# Patient Record
Sex: Male | Born: 1965 | Race: Black or African American | Hispanic: No | Marital: Married | Smoking: Never smoker
Health system: Southern US, Community
[De-identification: ages and names within clinical notes are randomized; demographics above are authoritative.]

## PROBLEM LIST (undated history)

## (undated) DIAGNOSIS — G4730 Sleep apnea, unspecified: Secondary | ICD-10-CM

## (undated) DIAGNOSIS — R683 Clubbing of fingers: Secondary | ICD-10-CM

## (undated) DIAGNOSIS — I10 Essential (primary) hypertension: Secondary | ICD-10-CM

## (undated) HISTORY — DX: Clubbing of fingers: R68.3

## (undated) HISTORY — PX: OTHER SURGICAL HISTORY: SHX169

## (undated) HISTORY — PX: NASAL SEPTUM SURGERY: SHX37

## (undated) HISTORY — DX: Sleep apnea, unspecified: G47.30

## (undated) HISTORY — PX: TONSILLECTOMY: SUR1361

## (undated) HISTORY — PX: HERNIA REPAIR: SHX51

## (undated) HISTORY — DX: Essential (primary) hypertension: I10

---

## 2004-10-25 ENCOUNTER — Ambulatory Visit (HOSPITAL_BASED_OUTPATIENT_CLINIC_OR_DEPARTMENT_OTHER): Admission: RE | Admit: 2004-10-25 | Discharge: 2004-10-25 | Payer: Self-pay | Admitting: General Surgery

## 2004-10-25 ENCOUNTER — Ambulatory Visit (HOSPITAL_COMMUNITY): Admission: RE | Admit: 2004-10-25 | Discharge: 2004-10-25 | Payer: Self-pay | Admitting: General Surgery

## 2004-10-25 ENCOUNTER — Observation Stay (HOSPITAL_COMMUNITY): Admission: AD | Admit: 2004-10-25 | Discharge: 2004-10-26 | Payer: Self-pay | Admitting: General Surgery

## 2006-08-22 ENCOUNTER — Emergency Department (HOSPITAL_COMMUNITY): Admission: EM | Admit: 2006-08-22 | Discharge: 2006-08-22 | Payer: Self-pay | Admitting: Emergency Medicine

## 2006-11-06 ENCOUNTER — Emergency Department (HOSPITAL_COMMUNITY): Admission: EM | Admit: 2006-11-06 | Discharge: 2006-11-06 | Payer: Self-pay | Admitting: Family Medicine

## 2007-05-30 ENCOUNTER — Ambulatory Visit (HOSPITAL_COMMUNITY): Admission: RE | Admit: 2007-05-30 | Discharge: 2007-05-30 | Payer: Self-pay | Admitting: Neurological Surgery

## 2008-06-18 ENCOUNTER — Emergency Department (HOSPITAL_COMMUNITY): Admission: EM | Admit: 2008-06-18 | Discharge: 2008-06-18 | Payer: Self-pay | Admitting: Emergency Medicine

## 2008-08-10 ENCOUNTER — Emergency Department (HOSPITAL_COMMUNITY): Admission: EM | Admit: 2008-08-10 | Discharge: 2008-08-11 | Payer: Self-pay | Admitting: Emergency Medicine

## 2009-06-06 ENCOUNTER — Observation Stay (HOSPITAL_COMMUNITY): Admission: EM | Admit: 2009-06-06 | Discharge: 2009-06-06 | Payer: Self-pay | Admitting: Emergency Medicine

## 2009-06-06 ENCOUNTER — Encounter (INDEPENDENT_AMBULATORY_CARE_PROVIDER_SITE_OTHER): Payer: Self-pay | Admitting: Emergency Medicine

## 2009-06-06 ENCOUNTER — Ambulatory Visit: Payer: Self-pay | Admitting: Cardiology

## 2011-01-13 LAB — POCT CARDIAC MARKERS
CKMB, poc: 2 ng/mL (ref 1.0–8.0)
Myoglobin, poc: 72.3 ng/mL (ref 12–200)
Troponin i, poc: <0.05 ng/mL (ref 0.00–0.09)
Troponin i, poc: <0.05 ng/mL (ref 0.00–0.09)

## 2011-01-13 LAB — POCT I-STAT, CHEM 8
BUN: 8 mg/dL (ref 6–23)
Calcium, Ion: 1.18 mmol/L (ref 1.12–1.32)
Chloride: 103 mEq/L (ref 96–112)
Glucose, Bld: 199 mg/dL — ABNORMAL HIGH (ref 70–99)
HCT: 46 % (ref 39.0–52.0)

## 2011-01-13 LAB — D-DIMER, QUANTITATIVE: D-Dimer, Quant: 0.26 ug/mL-FEU (ref 0.00–0.48)

## 2011-01-13 LAB — HEPATIC FUNCTION PANEL
AST: 27 U/L (ref 0–37)
Albumin: 3.5 g/dL (ref 3.5–5.2)

## 2011-02-23 NOTE — Op Note (Signed)
NAMERAFIK, KOPPEL              ACCOUNT NO.:  [id number]   MEDICAL RECORD NO.:  [id number]          PATIENT TYPE:  AMB   LOCATION:  [facility]                         FACILITY:  [facility]   PHYSICIAN:  Angelia Mould. Derrell Lolling, M.D.DATE OF BIRTH:  October 03, 1966   DATE OF PROCEDURE:  10/25/2004  DATE OF DISCHARGE:                                 OPERATIVE REPORT   PREOPERATIVE DIAGNOSIS:  Bilateral inguinal hernias.   POSTOPERATIVE DIAGNOSIS:  Bilateral inguinal hernias.   OPERATION PERFORMED:  Repair of bilateral inguinal hernias with  polypropylene mesh (Lichtenstein repairs).   SURGEON:  Angelia Mould. Derrell Lolling, M.D.   OPERATIVE INDICATION:  This is a 45 year old black man who has developed  painful bilateral inguinal hernias over the past few months.  He notices a  bulge on both sides and some discomfort, somewhat more on the right than on  the left.  He has been evaluated recently and his exam reveals bilateral  inguinal hernias that are medium sized.  They do not extend into the  scrotum.  They are tender but completely reducible.  There is no scrotal or  testicular mass.  He is brought to the operating room electively.   OPERATIVE FINDINGS:  The patient had bilateral indirect inguinal hernias of  the sliding type.  These were easily dissected away from the cord structures  and reduced.  He had some weakness in the direct floor of the inguinal canal  but no real bulge there.   OPERATIVE TECHNIQUE:  Following the induction of general endotracheal  anesthesia, the patient's lower abdomen and groins were prepped and draped  in a sterile fashion.  He was given intravenous antibiotics.  The patient  was identified.   Bilateral oblique incisions were made overlying both inguinal canals.  On  both sides, the dissection was carried down through the subcutaneous tissue  exposing the aponeurosis of the external oblique.  The conduct of the  procedure was essentially the same on both sides so I will  dictate the  technique just once and note any changes as we go along.   On each side, the external oblique was incised in the direction of its  fibers and opening up the external inguinal ring.  Self-retaining retractors  were placed.  The cord structures were mobilized and encircled with a  Penrose drain.  On both sides, he had large indirect hernia sacs that were  sliding hernia types.  These were dissected completely away from the cord.  Structures all the way back to the level of the internal inguinal ring.  The  indirect hernia sac was then reduced into the abdominal space.  The internal  ring was then closed with figure-of-eight sutures of 2-0 Vicryl and this  held the reduction in place quite nicely.  The cord was examined.  There was  no other abnormality.  On each side, the floor of the inguinal canal was  repaired and reinforced with an on-lay graft of polypropylene mesh.  A 3-  inch x 6-inch piece of mesh was brought to the operative field on each side.  It was trimmed at the  corners to fit the wound.  On each side, the mesh was  sutured in place with running sutures of 2-0 Prolene and interrupted  mattress sutures of 2-0 Prolene.  The mesh was sutured so as to generally  overlap the fascia at the pubic tubercle, along the inguinal ligament  inferiorly.  Medially a couple of interrupted mattress sutures were placed  to secure the mesh and superiorly and superolaterally, the mesh was sutured  in place with a running suture of 2-0 Prolene.  The mesh was incised  laterally so as to wrap around the cord structures at the internal ring.  The tails of the mesh were overlapped laterally and the suture line was  completed.  A couple of extra sutures were placed to tighten up the mesh  slightly around the cord structures.  This provided a very secure repair  both medially and lateral to the cord structures but allowed an adequate  opening for the cord structures.  The wounds were  irrigated with saline.  The external oblique was closed with a running suture of 2-0 Vicryl, placing  the cord structures deep to the external oblique.  Scarpa's fascia was  closed with 3-0 Vicryl sutures and the skin on each side was closed with a  running subcuticular suture of 4-0 Monocryl and Steri-Strips.  Clean  bandages were placed and the patient was taken to the recovery room in  stable condition.  Estimated blood loss was about 20 cc.  Complications:  None.  Sponge and instrument counts were correct.     Hayw   HMI/MEDQ  D:  10/25/2004  T:  10/25/2004  Job:  [id number]

## 2011-05-27 IMAGING — CR DG CHEST 2V
2 series · 2 of 2 positions shown · non-contrast
Comparison: 06/18/2008

CLINICAL DATA: Chest pain, shortness of breath.

CHEST - 2 VIEW

[w chest pa]
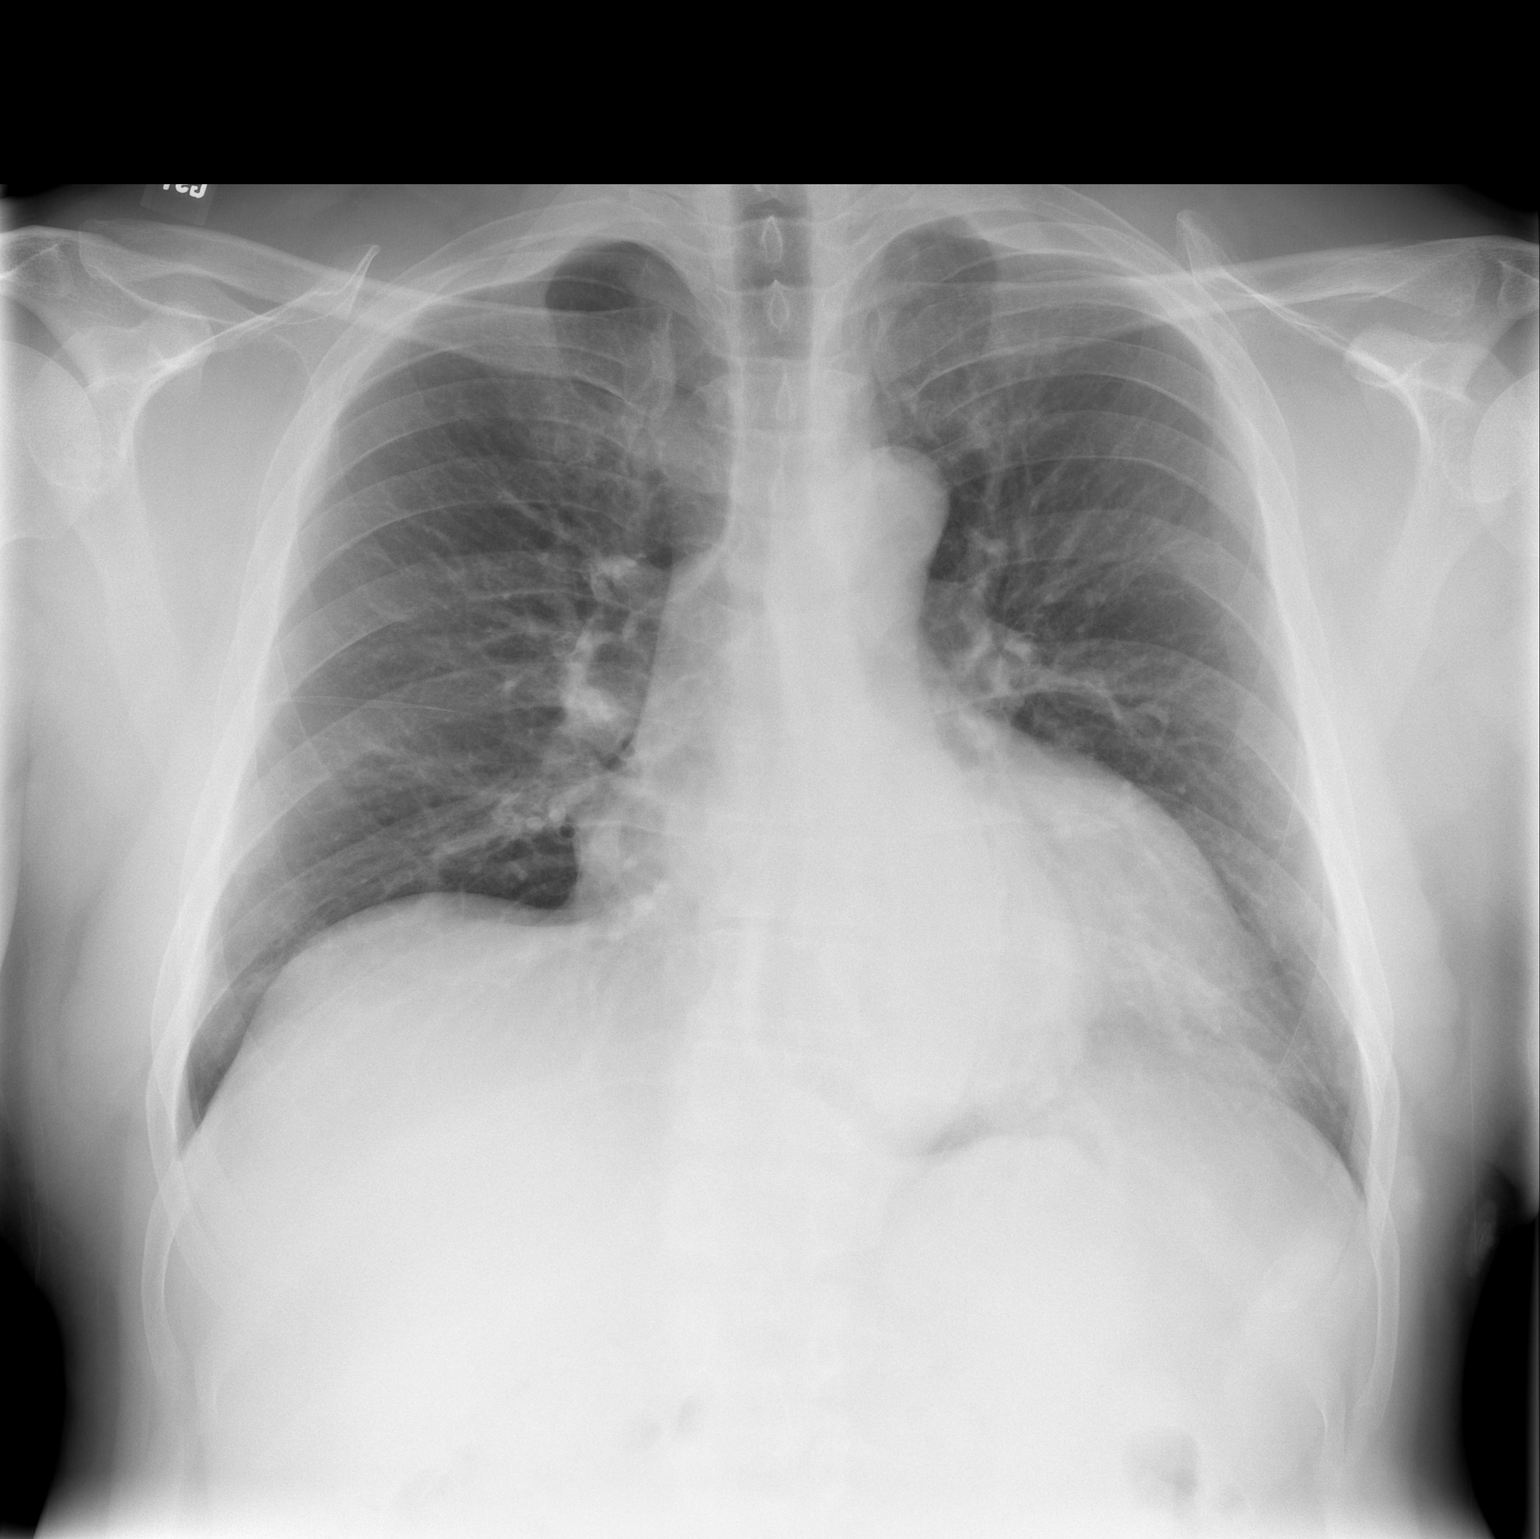

[w chest lat]
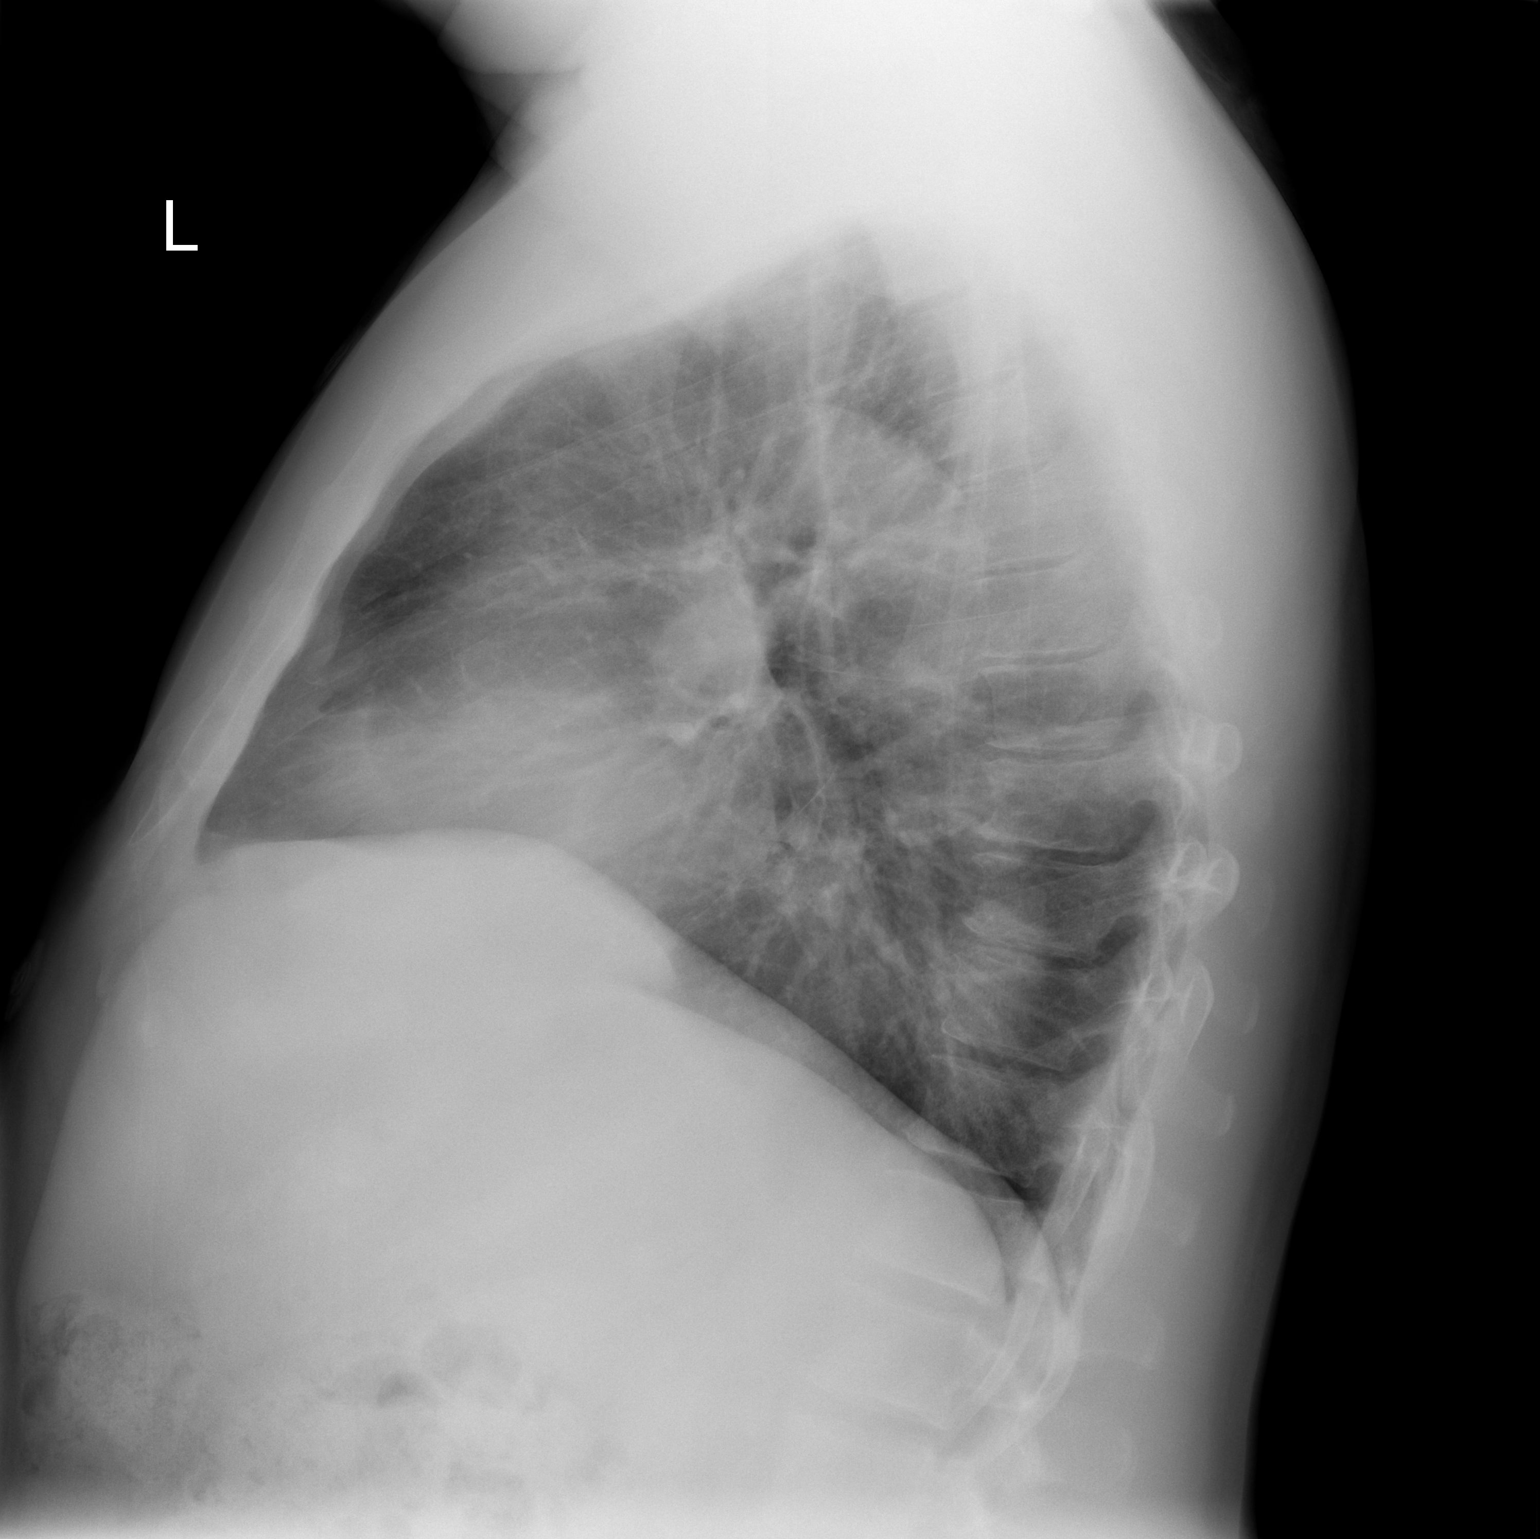

[2 of 2 positions shown; findings below may reference images not displayed]

FINDINGS: There is mild cardiomegaly.  No focal airspace opacities
or effusions.  Tortuosity of the thoracic aorta noted.  Stable
hiatal hernia.  No acute bony abnormality.
IMPRESSION: Stable cardiomegaly.

Hiatal hernia.

## 2011-07-10 LAB — POCT I-STAT, CHEM 8
Creatinine, Ser: 1.1
Glucose, Bld: 114 — ABNORMAL HIGH
Hemoglobin: 15.3
TCO2: 29

## 2011-07-10 LAB — POCT CARDIAC MARKERS: Myoglobin, poc: 94.3

## 2011-07-11 LAB — POCT I-STAT, CHEM 8
Calcium, Ion: 1.23
Glucose, Bld: 114 — ABNORMAL HIGH
HCT: 51
Hemoglobin: 17.3 — ABNORMAL HIGH
Potassium: 3.6
TCO2: 32

## 2011-07-11 LAB — URINALYSIS, ROUTINE W REFLEX MICROSCOPIC
Bilirubin Urine: NEGATIVE
Nitrite: NEGATIVE
Protein, ur: NEGATIVE
Specific Gravity, Urine: 1.004 — ABNORMAL LOW
Urobilinogen, UA: 0.2

## 2011-07-11 LAB — DIFFERENTIAL
Basophils Absolute: 0.4 — ABNORMAL HIGH
Basophils Relative: 6 — ABNORMAL HIGH
Eosinophils Absolute: 0.5
Monocytes Absolute: 0.6
Neutro Abs: 1.9
Neutrophils Relative %: 29 — ABNORMAL LOW

## 2011-07-11 LAB — POCT CARDIAC MARKERS: Troponin i, poc: <0.05

## 2011-07-11 LAB — CBC
MCHC: 33.5
RDW: 12.8

## 2011-12-31 ENCOUNTER — Ambulatory Visit (HOSPITAL_BASED_OUTPATIENT_CLINIC_OR_DEPARTMENT_OTHER): Payer: BC Managed Care – PPO | Attending: Family Medicine | Admitting: Radiology

## 2011-12-31 VITALS — Ht 72.0 in | Wt 235.0 lb

## 2011-12-31 DIAGNOSIS — G4733 Obstructive sleep apnea (adult) (pediatric): Secondary | ICD-10-CM | POA: Insufficient documentation

## 2012-01-04 DIAGNOSIS — R0989 Other specified symptoms and signs involving the circulatory and respiratory systems: Secondary | ICD-10-CM

## 2012-01-04 DIAGNOSIS — R0609 Other forms of dyspnea: Secondary | ICD-10-CM

## 2012-01-04 DIAGNOSIS — G4733 Obstructive sleep apnea (adult) (pediatric): Secondary | ICD-10-CM

## 2012-01-04 NOTE — Procedures (Signed)
NAMEJUAQUIN, Ian Vaughn              ACCOUNT NO.:  [id number]  MEDICAL RECORD NO.:  [id number]          PATIENT TYPE:  OUT  LOCATION:  [facility]                 FACILITY:  [facility]  PHYSICIAN:  Aleigh Grunden D. Maple Hudson, MD, FCCP, FACPDATE OF BIRTH:  17-Nov-1964  DATE OF STUDY:  12/31/2011                           NOCTURNAL POLYSOMNOGRAM  REFERRING PHYSICIAN:  Joanie Coddington MANNING  REFERRING PHYSICIAN:  Jeanice Lim, MD  INDICATION FOR STUDY:  Hypersomnia with sleep apnea.  EPWORTH SLEEPINESS SCORE:  11/24.  BMI 31.9, weight 235 pounds, height 72 inches, neck 14.5 inches.  HOME MEDICATIONS:  Charted and reviewed.  SLEEP ARCHITECTURE:  Total sleep time 272.5 minutes with sleep efficiency 75.8%.  Stage I was 11.7%, stage II 68.1%, stage III absent. REM 20.2% of total sleep time.  Sleep latency 4 minutes, REM latency 54 minutes, awake after sleep onset 39 minutes.  Arousal index 51.7. Bedtime medication:  Losartan, cinnamon bark, aspirin.  RESPIRATORY DATA:  Apnea/hypopnea index (AHI) 25.3 per hour.  A total of 115 events was scored including 32 obstructive apneas, 6 central apneas, 2 mixed apneas, 75 hypopneas.  Events were not positional.  REM AHI 51.3 per hour.  This is a diagnostic NPSG protocol as ordered and CPAP titration was not done.  OXYGEN DATA:  Moderate to loud snoring with oxygen desaturation to a nadir of 80% and a mean oxygen saturation through the study of 90.6% on room air.  A 72.3 minutes was recorded with room air saturation less than 90% during the study time.  CARDIAC DATA:  Sinus rhythm with PAC and PVCs.  MOVEMENT/PARASOMNIA:  No significant movement disturbance.  Bathroom x1.  IMPRESSION/RECOMMENDATION: 1. Moderate obstructive sleep apnea/hypopnea syndrome, AHI 25.3 per     hour with nonpositional events.  Moderate to loud snoring and     oxygen desaturation to a nadir of 80% with mean oxygen saturation     through the study of 90.6% on room air.  A total of  72.3 minutes     was recorded with room air saturation less than 90% during the     total study time. 2. Consider return for dedicated CPAP titration study or evaluate for     alternative clinical management as appropriate.     Linton Stolp D. Maple Hudson, MD, [facility], FACP Diplomate, American Board of Sleep Medicine    CDY/MEDQ  D:  01/04/2012 09:51:46  T:  01/04/2012 23:47:17  Job:  [id number]

## 2012-01-07 ENCOUNTER — Ambulatory Visit: Payer: BC Managed Care – PPO

## 2012-01-21 ENCOUNTER — Encounter: Payer: BC Managed Care – PPO | Attending: Family Medicine | Admitting: Dietician

## 2012-01-21 DIAGNOSIS — E119 Type 2 diabetes mellitus without complications: Secondary | ICD-10-CM | POA: Insufficient documentation

## 2012-01-21 DIAGNOSIS — Z713 Dietary counseling and surveillance: Secondary | ICD-10-CM | POA: Insufficient documentation

## 2012-01-22 ENCOUNTER — Encounter: Payer: Self-pay | Admitting: Dietician

## 2012-01-22 NOTE — Progress Notes (Signed)
  Patient was seen on 01/21/2012 for the first of a series of three diabetes self-management courses at the [facility]. The following learning objectives were met by the patient during this course:   Defines the role of glucose and insulin  Identifies type of diabetes and pathophysiology  Defines the diagnostic criteria for diabetes and prediabetes  States the risk factors for Type 2 Diabetes  States the symptoms of Type 2 Diabetes  Defines Type 2 Diabetes treatment goals  Defines Type 2 Diabetes treatment options  States the rationale for glucose monitoring  Identifies A1C, glucose targets, and testing times  Identifies proper sharps disposal  Defines the purpose of a diabetes food plan  Identifies carbohydrate food groups  Defines effects of carbohydrate foods on glucose levels  Identifies carbohydrate choices/grams/food labels  States benefits of physical activity and effect on glucose  Review of suggested activity guidelines  HgA1C: 8.3 % 12/24/2011  Handouts given during class include:  Type 2 Diabetes: Basics Book  My Food Plan Book  Food and Activity Log  Patient has established the following initial goals:  Increase my exercise  Follow a diabetes meal plan  Lose weight  Follow-Up Plan: Core Class 2 within one month

## 2012-02-14 ENCOUNTER — Encounter: Payer: BC Managed Care – PPO | Attending: Family Medicine

## 2012-02-14 DIAGNOSIS — E119 Type 2 diabetes mellitus without complications: Secondary | ICD-10-CM | POA: Insufficient documentation

## 2012-02-14 DIAGNOSIS — Z713 Dietary counseling and surveillance: Secondary | ICD-10-CM | POA: Insufficient documentation

## 2012-02-26 NOTE — Progress Notes (Signed)
  Patient was seen on 02/14/2012 for the second of a series of three diabetes self-management courses at the [facility]. The following learning objectives were met by the patient during this course:   Explain basic nutrition maintenance and quality assurance  Describe causes, symptoms and treatment of hypoglycemia and hyperglycemia  Explain how to manage diabetes during illness  Describe the importance of good nutrition for health and healthy eating strategies  List strategies to follow meal plan when dining out  Describe the effects of alcohol on glucose and how to use it safely  Describe problem solving skills for day-to-day glucose challenges  Describe strategies to use when treatment plan needs to change  Identify important factors involved in successful weight loss  Describe ways to remain physically active  Describe the impact of regular activity on insulin resistance  Handouts given in class:  Refrigerator magnet for Sick Day Guidelines  [facility] Oral medication/insulin handout  Follow-Up Plan: Patient will attend the final class of the ADA Diabetes Self-Care Education.

## 2012-02-28 ENCOUNTER — Ambulatory Visit: Payer: BC Managed Care – PPO

## 2012-04-29 ENCOUNTER — Ambulatory Visit: Payer: BC Managed Care – PPO

## 2012-05-27 ENCOUNTER — Encounter: Payer: BC Managed Care – PPO | Attending: Family Medicine | Admitting: Dietician

## 2012-05-27 DIAGNOSIS — E119 Type 2 diabetes mellitus without complications: Secondary | ICD-10-CM | POA: Insufficient documentation

## 2012-05-27 DIAGNOSIS — Z713 Dietary counseling and surveillance: Secondary | ICD-10-CM | POA: Insufficient documentation

## 2012-05-28 NOTE — Progress Notes (Signed)
  Patient was seen on 05/27/2012 for the third of a series of three diabetes self-management courses at the [facility]. The following learning objectives were met by the patient during this course:    Describe how diabetes changes over time   Identify diabetes complications and ways to prevent them   Describe strategies that can promote heart health including lowering blood pressure and cholesterol   Describe strategies to lower dietary fat and sodium in the diet   Identify physical activities that benefit cardiovascular health   Evaluate success in meeting personal goal   Describe the belief that they can live successfully with diabetes day to day   Establish 2-3 goals that they will plan to diligently work on until they return for the free 60-month follow-up visit  The following handouts were given in class:  3 Month Follow Up Visit handout  Goal setting handout  Class evaluation form  Your patient has established the following 3 month goals for diabetes self-care:  Be active 40 minutes or more 3 times per week.  To help manage stress, I will do Tai Chi at least 3 times per week.  Mountain Bike 5 + miles per week.  Follow-Up Plan: Patient will attend a 3 month follow-up visit for diabetes self-management education.

## 2012-07-07 ENCOUNTER — Emergency Department (HOSPITAL_COMMUNITY)
Admission: EM | Admit: 2012-07-07 | Discharge: 2012-07-08 | Disposition: A | Discharge status: Home / Self Care | Payer: BC Managed Care – PPO | Attending: Emergency Medicine | Admitting: Emergency Medicine

## 2012-07-07 ENCOUNTER — Encounter (HOSPITAL_COMMUNITY): Payer: Self-pay | Admitting: *Deleted

## 2012-07-07 ENCOUNTER — Emergency Department (HOSPITAL_COMMUNITY): Payer: BC Managed Care – PPO

## 2012-07-07 DIAGNOSIS — R5381 Other malaise: Secondary | ICD-10-CM | POA: Insufficient documentation

## 2012-07-07 DIAGNOSIS — R61 Generalized hyperhidrosis: Secondary | ICD-10-CM | POA: Insufficient documentation

## 2012-07-07 DIAGNOSIS — R5383 Other fatigue: Secondary | ICD-10-CM | POA: Insufficient documentation

## 2012-07-07 DIAGNOSIS — I10 Essential (primary) hypertension: Secondary | ICD-10-CM | POA: Insufficient documentation

## 2012-07-07 DIAGNOSIS — E119 Type 2 diabetes mellitus without complications: Secondary | ICD-10-CM | POA: Insufficient documentation

## 2012-07-07 DIAGNOSIS — Z79899 Other long term (current) drug therapy: Secondary | ICD-10-CM | POA: Insufficient documentation

## 2012-07-07 LAB — GLUCOSE, CAPILLARY

## 2012-07-07 NOTE — ED Notes (Signed)
Pt c/o feeling "weird" tonight; felt sweaty; wife took bp and was elevated; continues to feel weird; wife said earlier tonight she felt his pulse and it was irregular; pt warm and dry on triage assessment; denies chest pain or sob; states has had three episodes in last two wks of upper chest sensation like he is "choking on a marble"; c/o bilateral flank pain on and off last few wks

## 2012-07-08 LAB — POCT I-STAT, CHEM 8
Chloride: 102 mEq/L (ref 96–112)
Glucose, Bld: 204 mg/dL — ABNORMAL HIGH (ref 70–99)
HCT: 45 % (ref 39.0–52.0)
Hemoglobin: 15.3 g/dL (ref 13.0–17.0)
Potassium: 3.7 mEq/L (ref 3.5–5.1)
Sodium: 142 mEq/L (ref 135–145)

## 2012-07-08 LAB — URINALYSIS, ROUTINE W REFLEX MICROSCOPIC
Bilirubin Urine: NEGATIVE
Leukocytes, UA: NEGATIVE
Nitrite: NEGATIVE
Specific Gravity, Urine: 1.028 (ref 1.005–1.030)
Urobilinogen, UA: 0.2 mg/dL (ref 0.0–1.0)
pH: 6 (ref 5.0–8.0)

## 2012-07-08 LAB — POCT I-STAT TROPONIN I: Troponin i, poc: 0.01 ng/mL (ref 0.00–0.08)

## 2012-07-08 LAB — CBC WITH DIFFERENTIAL/PLATELET
Basophils Absolute: 0 10*3/uL (ref 0.0–0.1)
Basophils Relative: 1 % (ref 0–1)
HCT: 43.1 % (ref 39.0–52.0)
Hemoglobin: 14.9 g/dL (ref 13.0–17.0)
Lymphocytes Relative: 50 % — ABNORMAL HIGH (ref 12–46)
Monocytes Absolute: 0.4 10*3/uL (ref 0.1–1.0)
Monocytes Relative: 8 % (ref 3–12)
Neutro Abs: 1.7 10*3/uL (ref 1.7–7.7)
Neutrophils Relative %: 34 % — ABNORMAL LOW (ref 43–77)
RDW: 12.1 % (ref 11.5–15.5)

## 2012-07-08 NOTE — ED Provider Notes (Signed)
Medical screening examination/treatment/procedure(s) were performed by non-physician practitioner and as supervising physician I was immediately available for consultation/collaboration.   Lyanne Co, MD 07/08/12 [phone]

## 2012-07-08 NOTE — ED Provider Notes (Signed)
History     CSN: [id number]  Arrival date & time 07/07/12  2310   First MD Initiated Contact with Patient 07/07/12 2330      Chief Complaint  Patient presents with  . Hypertension    (Consider location/radiation/quality/duration/timing/severity/associated sxs/prior treatment) HPI Comments: After eating dinner patient and it became diaphoretic and felt weird.  His wife, took his blood pressure 3 times and found it to be elevated.  Each time.  Patient took his own, pulse and found it to be irregular.  Has a history of hypertension, diabetes, for which he does not take medication, but did lose weight.  He, states, that his physician.  Has been changing his blood pressure medicines to get better control.  As it has been difficult to manage this in the past.  He recently had his or start an increase from 50 mg daily to 100 mg daily with the rest of his medications remaining the same. Tonight.  He denied any shortness of breath or chest pain, numbness, or tingling of extremities  Patient is a 46 y.o. male presenting with hypertension. The history is provided by the patient.  Hypertension This is a recurrent problem. The current episode started today. The problem occurs constantly. The problem has been unchanged. Associated symptoms include diaphoresis and fatigue. Pertinent negatives include no abdominal pain, anorexia, arthralgias, change in bowel habit, chest pain, chills, congestion, coughing, fever, headaches, nausea or weakness.    Past Medical History  Diagnosis Date  . Hypertension   . Sleep apnea   . Diabetes mellitus     Past Surgical History  Procedure Date  . Hernia repair     No family history on file.  History  Substance Use Topics  . Smoking status: Never Smoker   . Smokeless tobacco: Not on file  . Alcohol Use: No      Review of Systems  Constitutional: Positive for diaphoresis and fatigue. Negative for fever and chills.  HENT: Negative for congestion.     Respiratory: Negative for cough.   Cardiovascular: Negative for chest pain.  Gastrointestinal: Negative for nausea, abdominal pain, anorexia and change in bowel habit.  Musculoskeletal: Negative for arthralgias.  Neurological: Negative for dizziness, weakness and headaches.    Allergies  Soy allergy  Home Medications   Current Outpatient Rx  Name Route Sig Dispense Refill  . CINNAMON BARK POWD Oral Take 1 capsule by mouth 2 (two) times daily.     Marland Kitchen HYDROCHLOROTHIAZIDE 25 MG PO TABS Oral Take 25 mg by mouth daily.     Marland Kitchen LOSARTAN POTASSIUM 50 MG PO TABS Oral Take 100 mg by mouth daily.     Marland Kitchen METFORMIN HCL 500 MG PO TABS Oral Take 500 mg by mouth daily as needed. For high glucose    . PSEUDOEPHEDRINE HCL ER 120 MG PO TB12 Oral Take 120 mg by mouth at bedtime.    . TERBINAFINE HCL 250 MG PO TABS Oral Take 250 mg by mouth daily.    . TESTIM 50 MG/5GM TD GEL Topical Apply 5 g topically 3 (three) times a week.       BP 167/89  Pulse 69  Temp 97.5 F (36.4 C)  Resp 20  SpO2 96%  Physical Exam  Constitutional: He is oriented to person, place, and time. He appears well-developed and well-nourished.  HENT:  Head: Normocephalic.  Eyes: Pupils are equal, round, and reactive to light.  Neck: Normal range of motion.  Cardiovascular: Normal rate.   Pulmonary/Chest: Effort  normal. No respiratory distress. He exhibits no tenderness.  Abdominal: He exhibits no distension.  Musculoskeletal: He exhibits no edema and no tenderness.  Neurological: He is alert and oriented to person, place, and time.  Skin: Skin is warm and dry.    ED Course  Procedures (including critical care time)  Labs Reviewed  GLUCOSE, CAPILLARY - Abnormal; Notable for the following:    Glucose-Capillary 221 (*)     All other components within normal limits  CBC WITH DIFFERENTIAL - Abnormal; Notable for the following:    Neutrophils Relative 34 (*)     Lymphocytes Relative 50 (*)     Eosinophils Relative 7 (*)      All other components within normal limits  URINALYSIS, ROUTINE W REFLEX MICROSCOPIC - Abnormal; Notable for the following:    Glucose, UA 250 (*)     All other components within normal limits  POCT I-STAT, CHEM 8 - Abnormal; Notable for the following:    Glucose, Bld 204 (*)     Calcium, Ion 1.30 (*)     All other components within normal limits  POCT I-STAT TROPONIN I   Dg Chest 2 View  07/08/2012  *RADIOLOGY REPORT*  Clinical Data: Left upper chest pain  CHEST - 2 VIEW  Comparison: 06/06/2009  Findings: The lungs are clear without focal infiltrate, edema, pneumothorax or pleural effusion. Cardiopericardial silhouette is at upper limits of normal for size.  Hiatal hernia again noted. Telemetry leads overlie the chest. Imaged bony structures of the thorax are intact.  IMPRESSION: Stable exam.  Cardiomegaly and hiatal hernia.  No acute cardiopulmonary process.   Original Report Authenticated By: ERIC A. MANSELL, M.D.      No diagnosis found.  ED ECG REPORT   Date: 07/08/2012  EKG Time: 2:26 AM  Rate: 71  Rhythm: normal sinus rhythm,  unchanged from previous tracings  Axis: normal  Intervals:none  ST&T Change: none  Narrative Interpretation: normal            MDM  Patient has been having labile blood pressures and irregular.  Pulse beats.  He notices this evening after eating dinner.  His primary care physician.  Has been changing his blood pressure medications for better control.  Last change was approximately one week ago, when he increased his Cozaar from 1 tablet daily to 2         Arman Filter, NP 07/08/12 4782

## 2012-08-20 ENCOUNTER — Ambulatory Visit: Payer: BC Managed Care – PPO | Admitting: Dietician

## 2012-09-09 ENCOUNTER — Other Ambulatory Visit (HOSPITAL_COMMUNITY): Payer: Self-pay | Admitting: Cardiovascular Disease

## 2012-09-09 DIAGNOSIS — I10 Essential (primary) hypertension: Secondary | ICD-10-CM

## 2012-09-09 DIAGNOSIS — R002 Palpitations: Secondary | ICD-10-CM

## 2012-09-12 ENCOUNTER — Ambulatory Visit (HOSPITAL_COMMUNITY)
Admission: RE | Admit: 2012-09-12 | Discharge: 2012-09-12 | Disposition: A | Discharge status: Home / Self Care | Payer: BC Managed Care – PPO | Source: Ambulatory Visit | Attending: Cardiovascular Disease | Admitting: Cardiovascular Disease

## 2012-09-12 DIAGNOSIS — I10 Essential (primary) hypertension: Secondary | ICD-10-CM

## 2012-09-12 DIAGNOSIS — R002 Palpitations: Secondary | ICD-10-CM | POA: Insufficient documentation

## 2012-10-15 ENCOUNTER — Other Ambulatory Visit (HOSPITAL_COMMUNITY): Payer: Self-pay | Admitting: Cardiovascular Disease

## 2012-10-15 DIAGNOSIS — I10 Essential (primary) hypertension: Secondary | ICD-10-CM

## 2012-10-23 ENCOUNTER — Ambulatory Visit (HOSPITAL_COMMUNITY)
Admission: RE | Admit: 2012-10-23 | Discharge: 2012-10-23 | Disposition: A | Discharge status: Home / Self Care | Payer: BC Managed Care – PPO | Source: Ambulatory Visit | Attending: Cardiovascular Disease | Admitting: Cardiovascular Disease

## 2012-10-23 ENCOUNTER — Inpatient Hospital Stay (HOSPITAL_COMMUNITY): Admission: RE | Admit: 2012-10-23 | Payer: BC Managed Care – PPO | Source: Ambulatory Visit

## 2012-10-23 DIAGNOSIS — I10 Essential (primary) hypertension: Secondary | ICD-10-CM | POA: Insufficient documentation

## 2012-10-23 NOTE — Progress Notes (Signed)
2D Echo Performed 10/23/2012    Ian Vaughn, RCS  

## 2012-11-25 ENCOUNTER — Encounter: Payer: Self-pay | Admitting: Gastroenterology

## 2012-12-12 ENCOUNTER — Ambulatory Visit: Payer: BC Managed Care – PPO | Admitting: Gastroenterology

## 2013-01-29 ENCOUNTER — Encounter: Payer: Self-pay | Admitting: Cardiovascular Disease

## 2013-11-05 ENCOUNTER — Other Ambulatory Visit: Payer: Self-pay

## 2013-11-05 MED ORDER — ATENOLOL 25 MG PO TABS: 25.0000 mg | ORAL_TABLET | Freq: Two times a day (BID) | ORAL | Status: DC

## 2013-11-05 NOTE — Telephone Encounter (Signed)
Rx was sent to pharmacy electronically. 

## 2013-11-17 ENCOUNTER — Other Ambulatory Visit: Payer: Self-pay | Admitting: Cardiovascular Disease

## 2013-11-18 NOTE — Telephone Encounter (Signed)
Rx was sent to pharmacy electronically. 

## 2013-11-26 ENCOUNTER — Emergency Department (HOSPITAL_COMMUNITY): Payer: BC Managed Care – PPO

## 2013-11-26 ENCOUNTER — Encounter (HOSPITAL_COMMUNITY): Payer: Self-pay | Admitting: Emergency Medicine

## 2013-11-26 ENCOUNTER — Emergency Department (HOSPITAL_COMMUNITY)
Admission: EM | Admit: 2013-11-26 | Discharge: 2013-11-27 | Disposition: A | Discharge status: Home / Self Care | Payer: BC Managed Care – PPO | Attending: Emergency Medicine | Admitting: Emergency Medicine

## 2013-11-26 DIAGNOSIS — IMO0002 Reserved for concepts with insufficient information to code with codable children: Secondary | ICD-10-CM | POA: Insufficient documentation

## 2013-11-26 DIAGNOSIS — K449 Diaphragmatic hernia without obstruction or gangrene: Secondary | ICD-10-CM | POA: Insufficient documentation

## 2013-11-26 DIAGNOSIS — Z79899 Other long term (current) drug therapy: Secondary | ICD-10-CM | POA: Insufficient documentation

## 2013-11-26 DIAGNOSIS — K219 Gastro-esophageal reflux disease without esophagitis: Secondary | ICD-10-CM | POA: Insufficient documentation

## 2013-11-26 DIAGNOSIS — E119 Type 2 diabetes mellitus without complications: Secondary | ICD-10-CM | POA: Insufficient documentation

## 2013-11-26 DIAGNOSIS — I10 Essential (primary) hypertension: Secondary | ICD-10-CM | POA: Insufficient documentation

## 2013-11-26 MED ORDER — GI COCKTAIL ~~LOC~~
30.0000 mL | Freq: Once | ORAL | Status: AC
  Administered 2013-11-26: 30 mL via ORAL
  Filled 2013-11-26: qty 30

## 2013-11-26 MED ORDER — KETOROLAC TROMETHAMINE 30 MG/ML IJ SOLN
30.0000 mg | Freq: Once | INTRAMUSCULAR | Status: DC
  Filled 2013-11-26: qty 2

## 2013-11-26 MED ORDER — KETOROLAC TROMETHAMINE 15 MG/ML IJ SOLN
30.0000 mg | Freq: Once | INTRAMUSCULAR | Status: AC
  Administered 2013-11-27: 30 mg via INTRAVENOUS

## 2013-11-26 NOTE — ED Notes (Signed)
Pt to xray at this time.

## 2013-11-26 NOTE — ED Notes (Signed)
Pt states he has been having chest pain for the past 5 days  Pt states the pain is midsternal and is like a tightness or a squeezing  Pt states the pain is worse in the morning than at night

## 2013-11-26 NOTE — ED Provider Notes (Signed)
CSN: [id number]     Arrival date & time 11/26/13  2244 History   First MD Initiated Contact with Patient 11/26/13 2303     Chief Complaint  Patient presents with  . Chest Pain     (Consider location/radiation/quality/duration/timing/severity/associated sxs/prior Treatment) Patient is a 48 y.o. male presenting with chest pain. The history is provided by the patient.  Chest Pain Pain location:  Epigastric Pain quality: sharp   Pain radiates to:  Does not radiate Pain radiates to the back: no   Pain severity:  Moderate Onset quality:  Gradual Duration:  5 days Timing:  Constant (worse in the am) Progression:  Worsening Chronicity:  New Context: no stress   Relieved by:  Nothing Worsened by:  Nothing tried Ineffective treatments:  None tried Associated symptoms: abdominal pain   Risk factors: male sex     Past Medical History  Diagnosis Date  . Hypertension   . Sleep apnea   . Diabetes mellitus   . Finger clubbing    Past Surgical History  Procedure Laterality Date  . Hernia repair    . Tonsillectomy    . Nasal septum surgery    . Uvula surgery     Family History  Problem Relation Age of Onset  . Hypertension Other   . Diabetes Other    History  Substance Use Topics  . Smoking status: Never Smoker   . Smokeless tobacco: Not on file  . Alcohol Use: Yes     Comment: dinner wine    Review of Systems  Cardiovascular: Positive for chest pain.  Gastrointestinal: Positive for abdominal pain.  All other systems reviewed and are negative.      Allergies  Soy allergy  Home Medications   Current Outpatient Rx  Name  Route  Sig  Dispense  Refill  . amLODipine (NORVASC) 5 MG tablet   Oral   Take 5 mg by mouth at bedtime.          Marland Kitchen. atenolol (TENORMIN) 25 MG tablet   Oral   Take 50 mg by mouth at bedtime.         Marland Kitchen. CIALIS 5 MG tablet   Oral   Take 5 mg by mouth as needed.         Thressa Sheller. Cinnamon Bark POWD   Oral   Take 1 capsule by mouth 2 (two)  times daily.          . fluticasone (FLONASE) 50 MCG/ACT nasal spray   Each Nare   Place 1 spray into both nostrils at bedtime.         . metformin (FORTAMET) 500 MG (OSM) 24 hr tablet   Oral   Take 500 mg by mouth daily with breakfast.          BP 152/89  Pulse 53  Temp(Src) 98.3 F (36.8 C) (Oral)  Resp 15  Ht 6' (1.829 m)  Wt 247 lb (112.038 kg)  BMI 33.49 kg/m2  SpO2 97% Physical Exam  Constitutional: He is oriented to person, place, and time. He appears well-developed and well-nourished. No distress.  HENT:  Head: Normocephalic and atraumatic.  Mouth/Throat: Oropharynx is clear and moist.  Eyes: Conjunctivae are normal. Pupils are equal, round, and reactive to light.  Neck: Normal range of motion. Neck supple.  Cardiovascular: Normal rate, regular rhythm and intact distal pulses.   Pulmonary/Chest: Effort normal and breath sounds normal. He has no wheezes. He has no rales.  Abdominal: Soft. Bowel sounds are increased.  There is no rebound and no guarding.  Musculoskeletal: Normal range of motion.  Neurological: He is alert and oriented to person, place, and time.  Skin: Skin is warm and dry.  Psychiatric: He has a normal mood and affect.    ED Course  Procedures (including critical care time) Labs Review Labs Reviewed  CBC  HEPATIC FUNCTION PANEL  LIPASE, BLOOD  D-DIMER, QUANTITATIVE  I-STAT TROPOININ, ED   Imaging Review No results found.  EKG Interpretation    Date/Time:  Thursday November 26 2013 22:49:41 EST Ventricular Rate:  55 PR Interval:  169 QRS Duration: 105 QT Interval:  445 QTC Calculation: 426 R Axis:   51 Text Interpretation:  Sinus rhythm Confirmed by [facility]  MD, Merline Perkin ([id number]) on 11/26/2013 11:09:22 PM            MDM   Final diagnoses:  None    In the setting of > 8 hours of constant pain with negative EKG and troponin ACS is excluded. Pain is consistent with GERd and was ameliorated with a GI cocktail.  Dddimer  negative.  CXR negative.  Follow up with your PMD for ongoing care.  Patient verbalizes understanding and agrees to follow up    Suhaib Guzzo Smitty Cords, MD 11/27/13 8119

## 2013-11-27 LAB — BASIC METABOLIC PANEL
BUN: 14 mg/dL (ref 6–23)
CALCIUM: 9.9 mg/dL (ref 8.4–10.5)
CO2: 24 mEq/L (ref 19–32)
CREATININE: 1.21 mg/dL (ref 0.50–1.35)
Chloride: 99 mEq/L (ref 96–112)
GFR calc Af Amer: 80 mL/min — ABNORMAL LOW (ref >90)
GFR, EST NON AFRICAN AMERICAN: 69 mL/min — AB (ref >90)
GLUCOSE: 139 mg/dL — AB (ref 70–99)
POTASSIUM: 4 meq/L (ref 3.7–5.3)
Sodium: 139 mEq/L (ref 137–147)

## 2013-11-27 LAB — D-DIMER, QUANTITATIVE: D-Dimer, Quant: 0.42 ug/mL-FEU (ref 0.00–0.48)

## 2013-11-27 LAB — HEPATIC FUNCTION PANEL
ALK PHOS: 98 U/L (ref 39–117)
ALT: 27 U/L (ref 0–53)
AST: 25 U/L (ref 0–37)
Albumin: 3.9 g/dL (ref 3.5–5.2)
BILIRUBIN TOTAL: 0.2 mg/dL — AB (ref 0.3–1.2)
Total Protein: 7.2 g/dL (ref 6.0–8.3)

## 2013-11-27 LAB — CBC
HEMATOCRIT: 43.4 % (ref 39.0–52.0)
HEMOGLOBIN: 14.9 g/dL (ref 13.0–17.0)
MCH: 31.9 pg (ref 26.0–34.0)
MCHC: 34.3 g/dL (ref 30.0–36.0)
MCV: 92.9 fL (ref 78.0–100.0)
Platelets: 212 10*3/uL (ref 150–400)
RBC: 4.67 MIL/uL (ref 4.22–5.81)
RDW: 12.4 % (ref 11.5–15.5)
WBC: 4.6 10*3/uL (ref 4.0–10.5)

## 2013-11-27 LAB — TROPONIN I: Troponin I: <0.3 ng/mL (ref <0.30)

## 2013-11-27 LAB — LIPASE, BLOOD: LIPASE: 33 U/L (ref 11–59)

## 2013-11-27 MED ORDER — SUCRALFATE 1 GM/10ML PO SUSP: 1.0000 g | Freq: Three times a day (TID) | ORAL | Status: AC

## 2013-11-27 NOTE — ED Notes (Signed)
Placed call to lab, stated they just received the BMP and Troponin orders and will be ran at this time

## 2013-11-27 NOTE — Discharge Instructions (Signed)

## 2014-06-28 IMAGING — CR DG CHEST 2V
2 series · 2 of 2 positions shown · non-contrast
Comparison: 06/06/2009

CLINICAL DATA: Left upper chest pain

CHEST - 2 VIEW

[w chest pa]
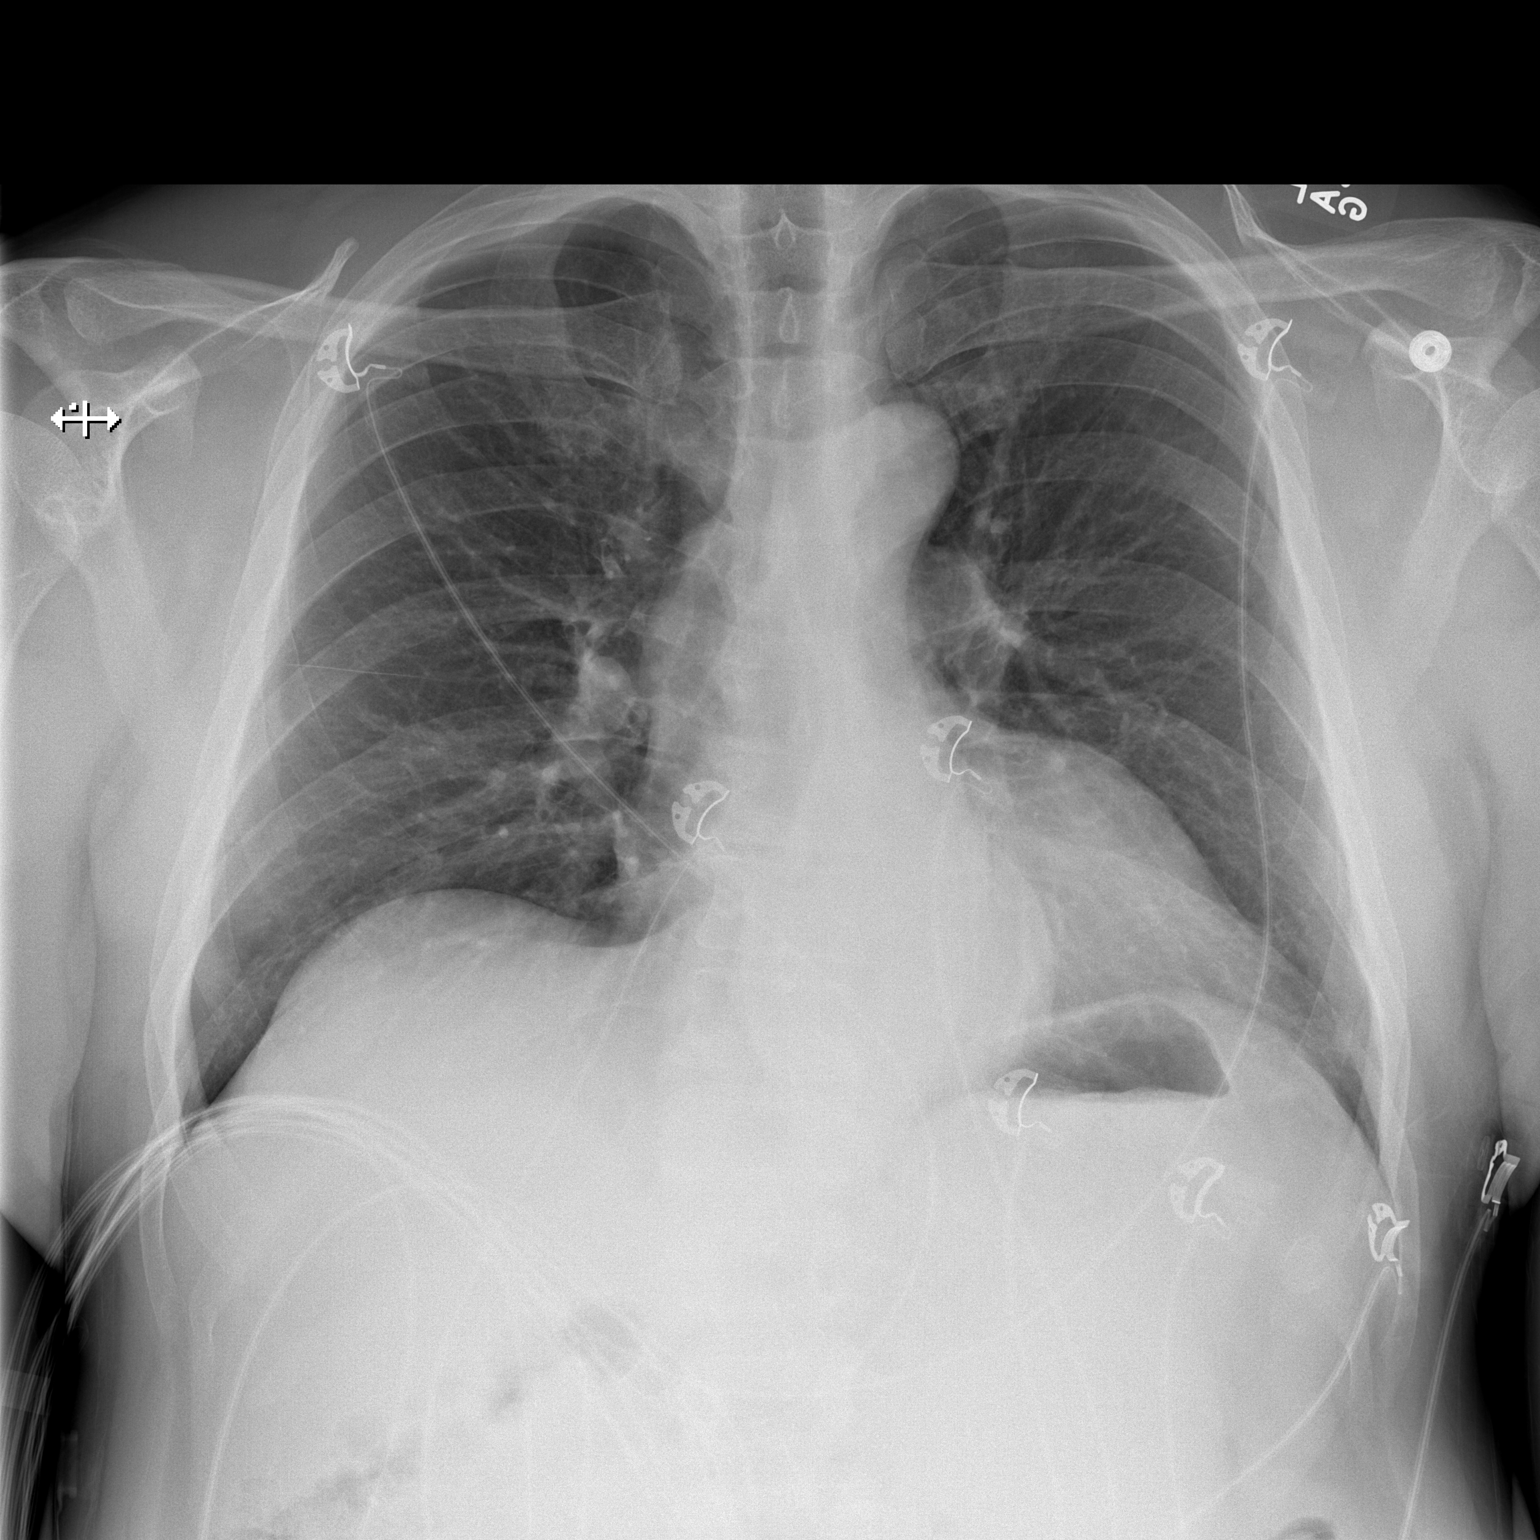

[w chest lat]
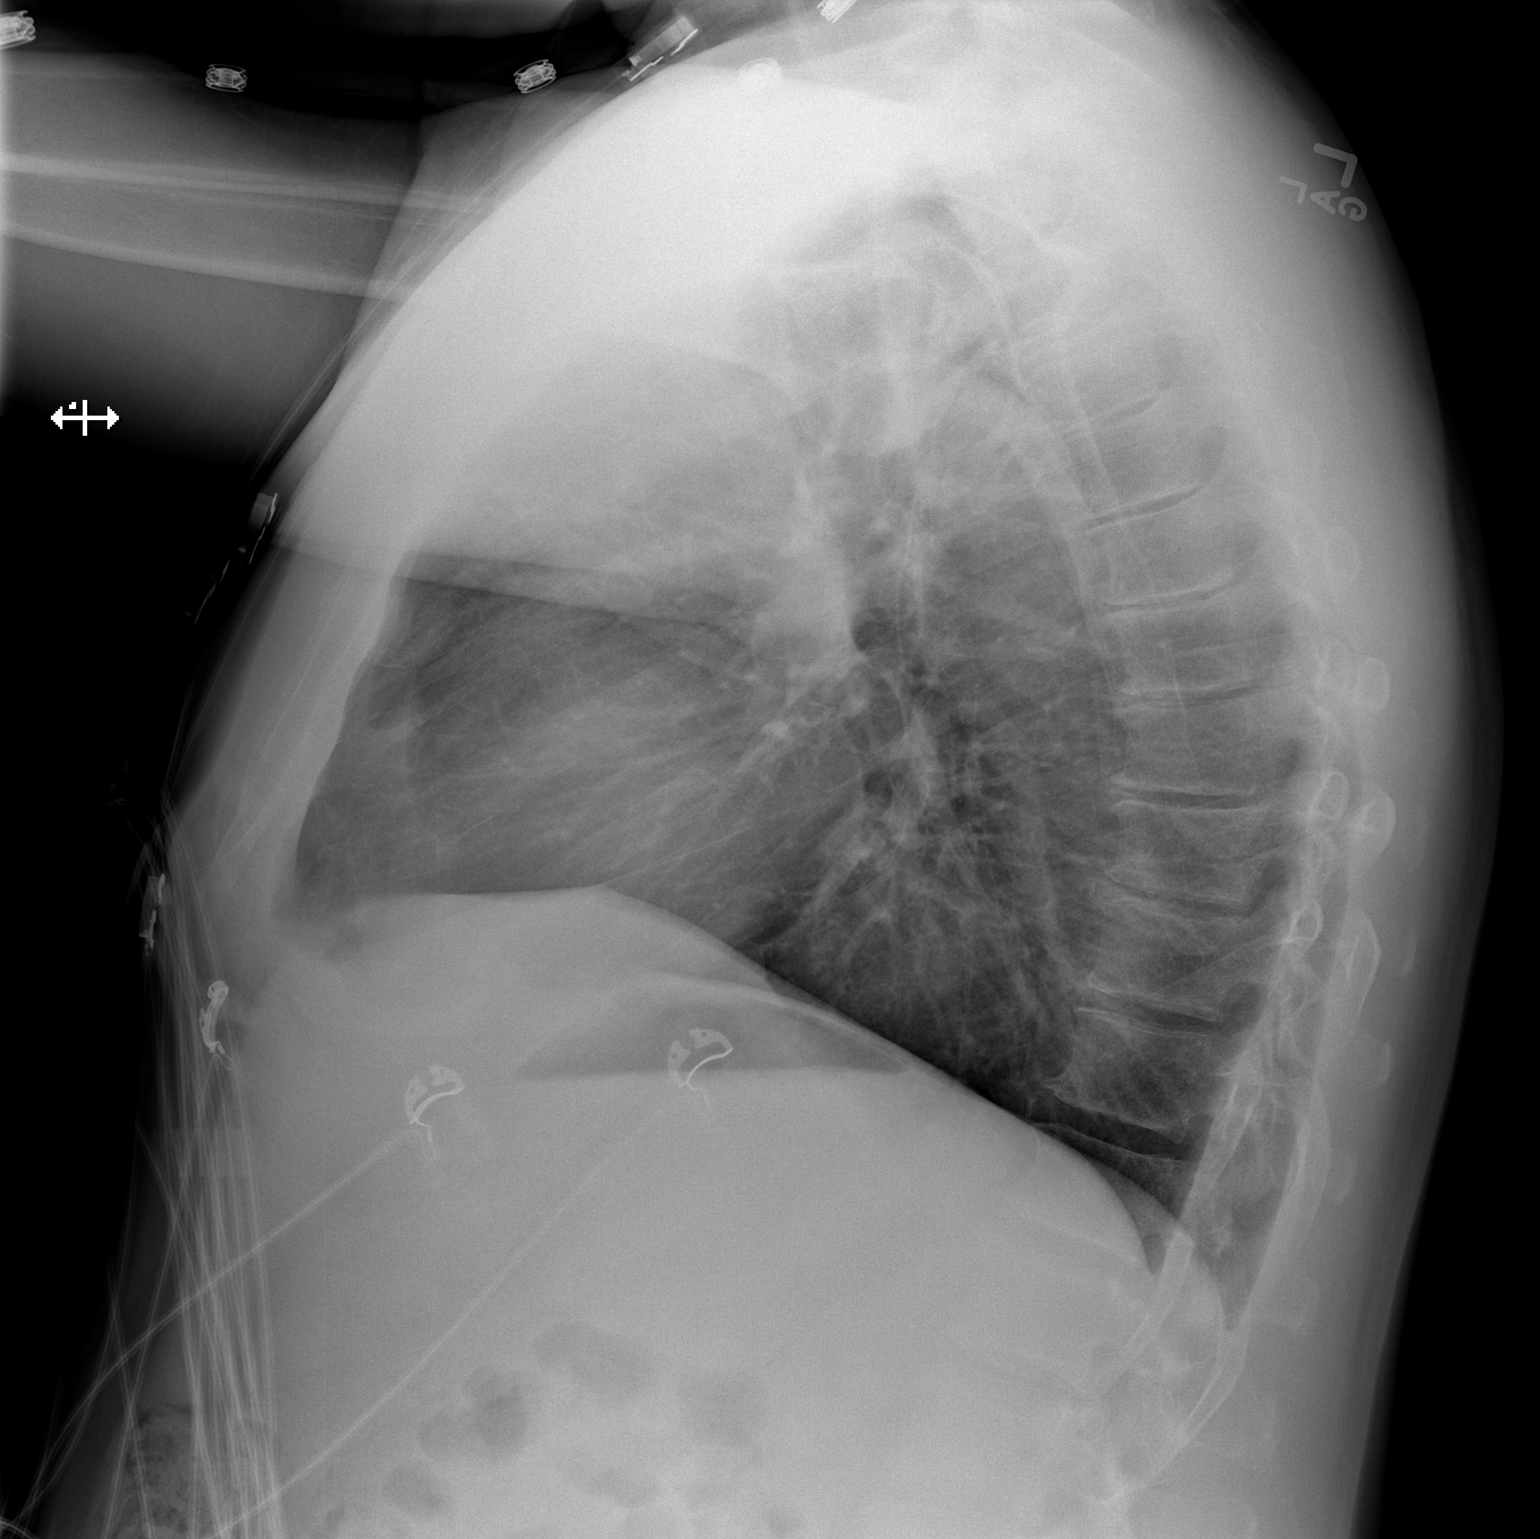

[2 of 2 positions shown; findings below may reference images not displayed]

FINDINGS: The lungs are clear without focal infiltrate, edema,
pneumothorax or pleural effusion. Cardiopericardial silhouette is
at upper limits of normal for size.  Hiatal hernia again noted.
Telemetry leads overlie the chest. Imaged bony structures of the
thorax are intact.
IMPRESSION: Stable exam.  Cardiomegaly and hiatal hernia.  No acute
cardiopulmonary process.
